# Patient Record
Sex: Female | Born: 2000 | Race: White | Hispanic: No | Marital: Single
Health system: Southern US, Community
[De-identification: ages and names within clinical notes are randomized; demographics above are authoritative.]

---
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
## 2020-02-18 IMAGING — CR DG FACIAL BONES COMPLETE 3+V
3 series · 3 of 3 positions shown · non-contrast
Comparison: None.

CLINICAL DATA: Soccer injury with facial pain on the left, initial
encounter

EXAM:
FACIAL BONES COMPLETE 3+V

[w waters *]
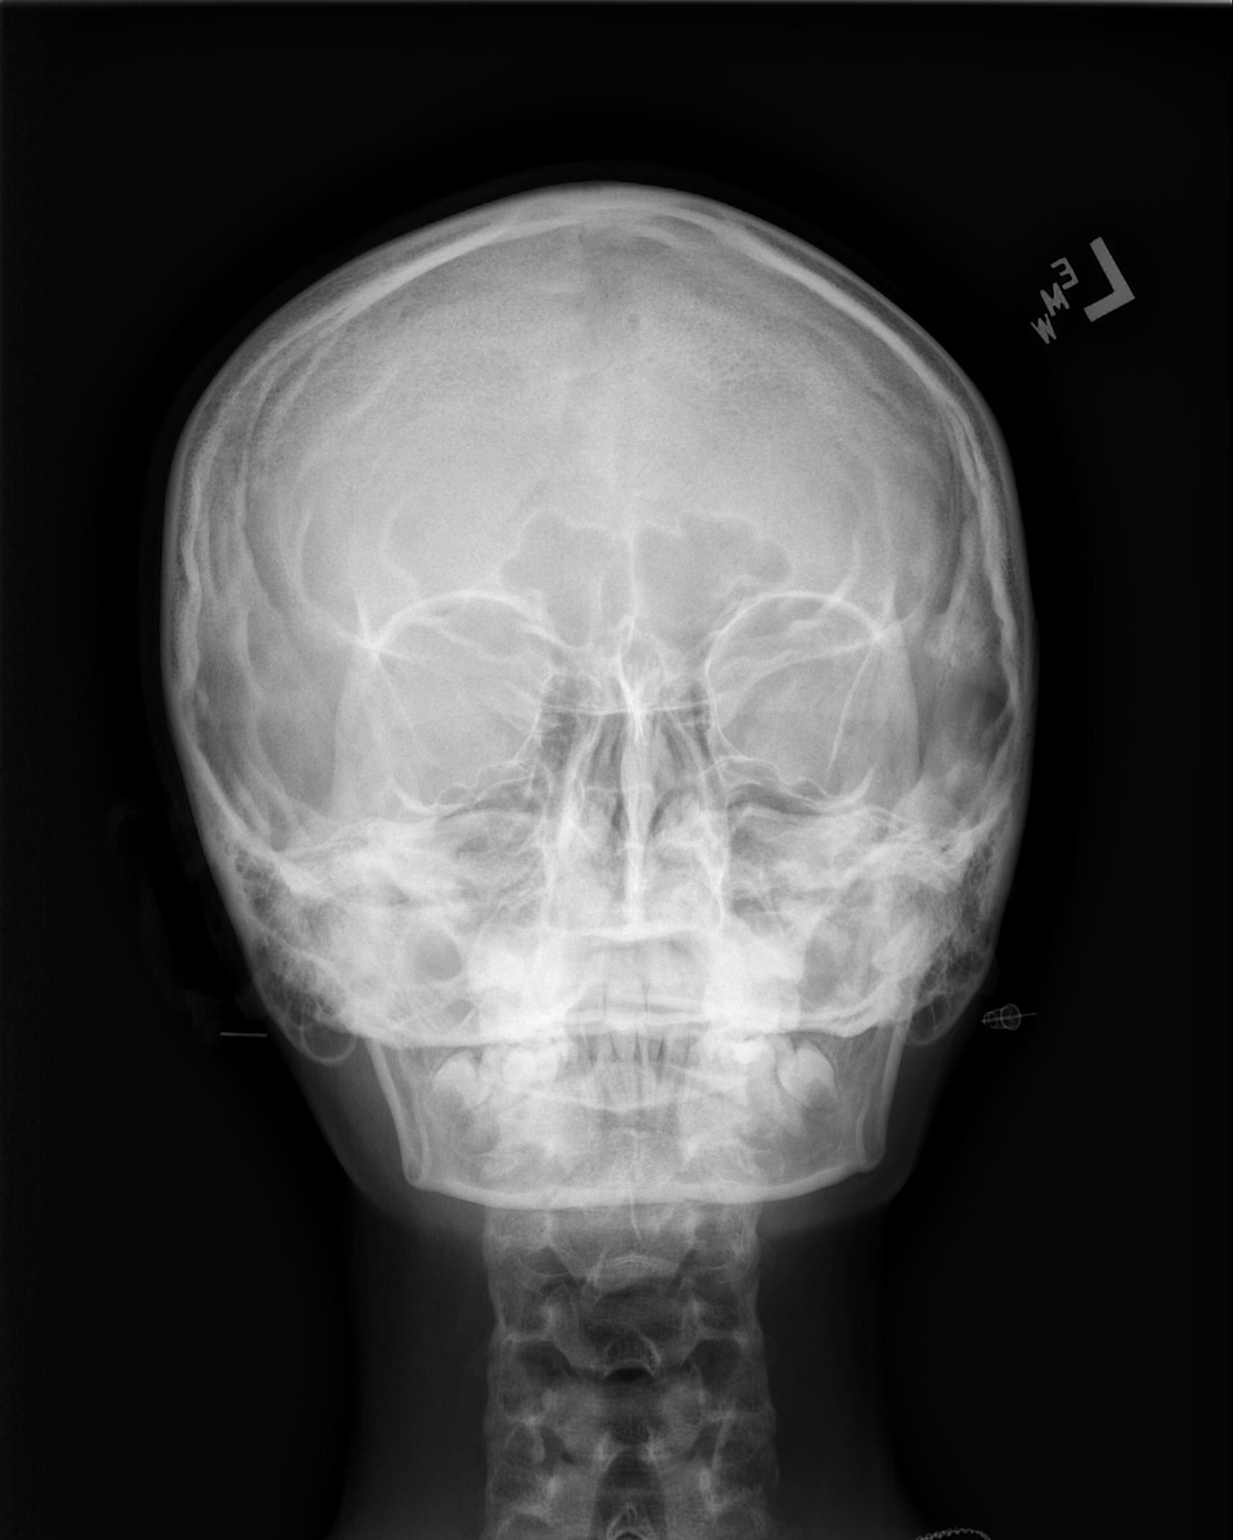

[[person_name] *]
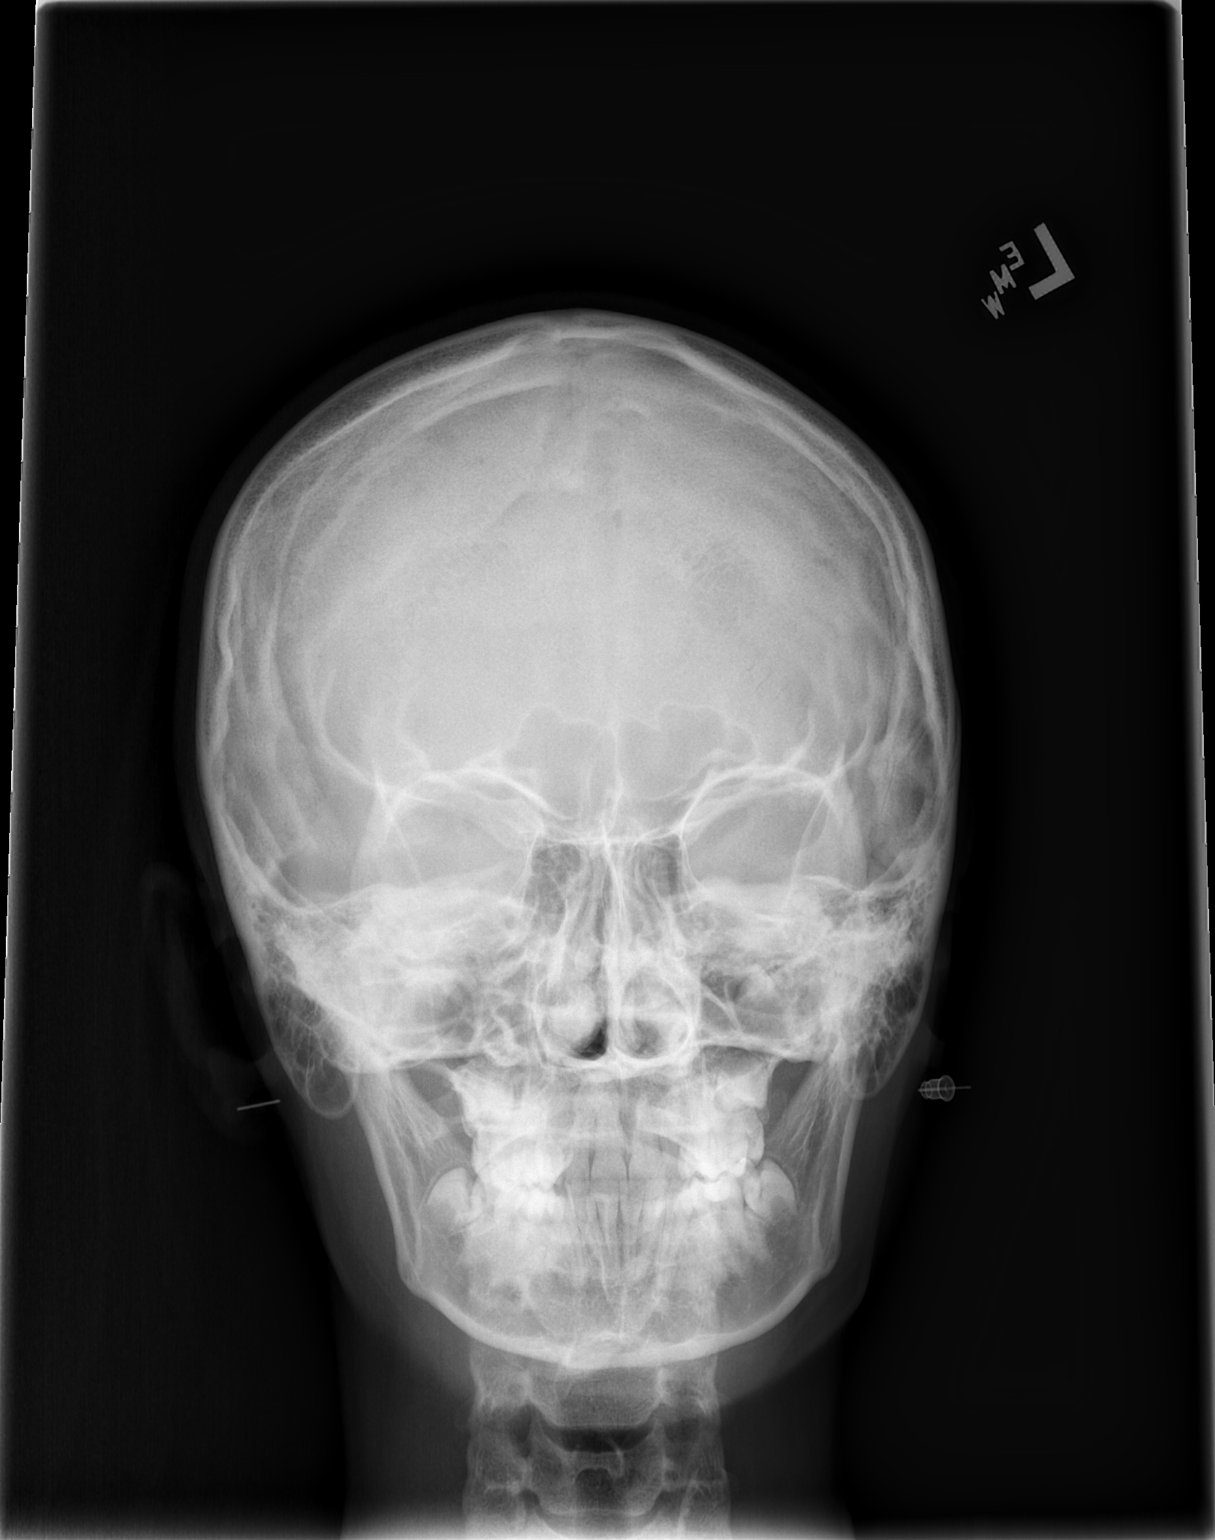

[w skull lat]
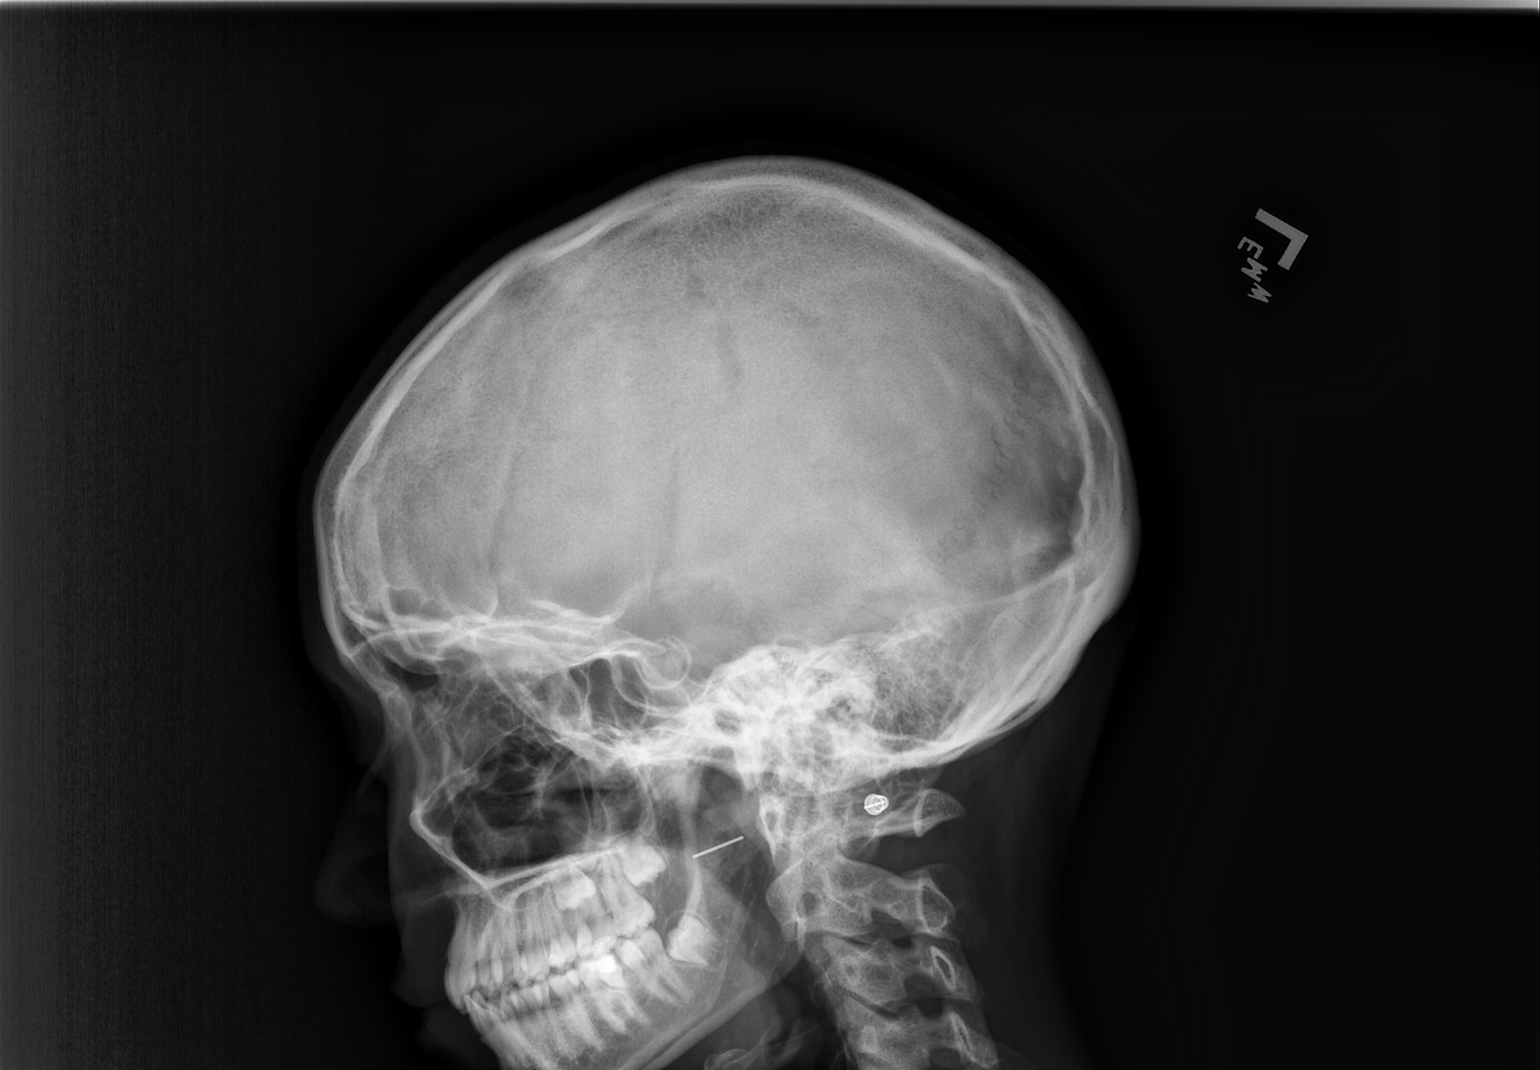

[3 of 3 positions shown; findings below may reference images not displayed]

FINDINGS: There is no evidence of fracture or other significant bone
abnormality. No orbital emphysema or sinus air-fluid levels are
seen.
IMPRESSION: No acute abnormality noted.

## 2024-01-03 ENCOUNTER — Ambulatory Visit: Admitting: Neurology

## 2024-05-21 ENCOUNTER — Ambulatory Visit: Admitting: Neurology
